# Patient Record
Sex: Male | Born: 2000 | Hispanic: No | Marital: Single | Smoking: Never smoker
Health system: Southern US, Community
[De-identification: ages and names within clinical notes are randomized; demographics above are authoritative.]

## PROBLEM LIST (undated history)

## (undated) DIAGNOSIS — F909 Attention-deficit hyperactivity disorder, unspecified type: Secondary | ICD-10-CM

## (undated) DIAGNOSIS — G4700 Insomnia, unspecified: Secondary | ICD-10-CM

---
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
## 2019-01-04 ENCOUNTER — Ambulatory Visit (HOSPITAL_COMMUNITY): Payer: Medicaid Other | Admitting: Psychiatry

## 2019-01-18 ENCOUNTER — Other Ambulatory Visit: Payer: Self-pay

## 2019-01-18 ENCOUNTER — Encounter (HOSPITAL_COMMUNITY): Payer: Self-pay | Admitting: Psychiatry

## 2019-01-18 ENCOUNTER — Ambulatory Visit (INDEPENDENT_AMBULATORY_CARE_PROVIDER_SITE_OTHER): Payer: Medicaid Other | Admitting: Psychiatry

## 2019-01-18 DIAGNOSIS — F4324 Adjustment disorder with disturbance of conduct: Secondary | ICD-10-CM

## 2019-01-18 NOTE — Progress Notes (Signed)
Client: Ryan Hammond  Date: 01/18/19  Time: 11:07-11:57  Type of Therapy: Family therapy with client  Axis I/II Diagnosis: Adjustment Disorder with mixed conduct; ADHD  Treatment goals addressed: To stabilize impulse control to better prepare for young adulthood and independent living.  Interventions: CBT, Motivational Interviewing, Psychoeducation, Coping Skill Building  Summary: Client Max, 18yo male who presents with Adjustment Disorder and ADHD, due to impulse control issues and intense aggression when violated. Counselor using therapeutic interventions to address negative impact impulse control issues and to prepare for completing GED, graduating from [place] program, building up support system, gaining employment and housing.  Therapist Response: Max and Caregiver, Kev met with Counselor for Family therapy. Counselor joined with Max and Kev as they reported on his visit home, upcoming birthday, family conflicts, and his housing and treatment plan. Counselor assessed current psychiatric symptoms and life stressors with Max. Max presented as less anxious than normal. Both Max and Kev reported being tired and sluggish, as their schedule and routine has been thrown off due to the COVID-19 regulations. Counselor processed Max's time with his family and explored his options for being discharged from residential treatments. Counselor praised Max for initiating services with [employer]. Max reported being hopeful about starting a job and being able to practice managing his money, which is part of the progress his mother is looking for. Counselor processed ways to make the most of his treatment and time and discussed peer relations. Counselor discussed meeting via webex at the next session and exchanged contact information. Counselor encouraged Max and Kev to continue supporting each other through this transition and to prompt use of health coping skills.  Suicidal/Homicidal: No current safety  concerns. No plan/intent to harm self or others.  Plan: To return in 3 weeks. Will apply skills learned in session at home, until next session.  ?  Lise Auer, LCSW

## 2019-02-08 ENCOUNTER — Ambulatory Visit (INDEPENDENT_AMBULATORY_CARE_PROVIDER_SITE_OTHER): Payer: Medicaid Other | Admitting: Psychiatry

## 2019-02-08 ENCOUNTER — Other Ambulatory Visit: Payer: Self-pay

## 2019-02-08 ENCOUNTER — Encounter (HOSPITAL_COMMUNITY): Payer: Self-pay | Admitting: Psychiatry

## 2019-02-08 DIAGNOSIS — F4324 Adjustment disorder with disturbance of conduct: Secondary | ICD-10-CM

## 2019-02-08 NOTE — Progress Notes (Signed)
Virtual Visit via Video Note  I connected with Ryan Hammond on 02/08/19 at 10:00 AM EDT by a video enabled telemedicine application and verified that I am speaking with the correct person using two identifiers.   I discussed the limitations of evaluation and management by telemedicine and the availability of in person appointments. The patient expressed understanding and agreed to proceed.  History of Present Illness: Adjustment disorder with disturbance of conduct due to trauma reactions, familial issues, adverse childhood events and stage of life issues.    Observations/Objective: Counselor met with Ryan Hammond via Webex for individual therapy to address treatment plan goals. Counselor assessed mental health status and symptoms. Ryan Hammond reported that his anxiety about the "stay-at-home" order has decreased and he is learning how to manage mental health in a healthier way. Counselor explored ways of coping. Ryan Hammond reported on a variety of coping skills such as playing basketball, doing projects, making social media videos, connecting with friends and family via facetime and trying to rest/relax. Counselor assessed progress towards independence. Ryan Hammond reported that he will staying in the program for another year, due to the set back with COVID-19. Ryan Hammond is ok with this change in plans and knows he is in a very supportive environment. Counselor summarized the session and encouraged maintenance of stability.   Assessment and Plan: Ryan Hammond will continue working on goals towards independence, including starting online GED program and following up with Support[employer] Employment. Counselor will meet with Ryan Hammond again in 3 weeks.  Follow Up Instructions: Counselor will set up link for next session via webex.    I discussed the assessment and treatment plan with the patient. The patient was provided an opportunity to ask questions and all were answered. The patient agreed with the plan and demonstrated an understanding of the  instructions.   The patient was advised to call back or seek an in-person evaluation if the symptoms worsen or if the condition fails to improve as anticipated.  I provided 45 minutes of non-face-to-face time during this encounter.   Lise Auer, LCSW

## 2019-03-01 ENCOUNTER — Encounter (HOSPITAL_COMMUNITY): Payer: Self-pay | Admitting: Psychiatry

## 2019-03-01 ENCOUNTER — Ambulatory Visit (INDEPENDENT_AMBULATORY_CARE_PROVIDER_SITE_OTHER): Payer: Medicaid Other | Admitting: Psychiatry

## 2019-03-01 ENCOUNTER — Other Ambulatory Visit: Payer: Self-pay

## 2019-03-01 DIAGNOSIS — F4324 Adjustment disorder with disturbance of conduct: Secondary | ICD-10-CM

## 2019-03-01 NOTE — Progress Notes (Signed)
Virtual Visit via Video Note  I connected with Ryan Hammond on 03/01/19 at  2:30 PM EDT by a video enabled telemedicine application and verified that I am speaking with the correct person using two identifiers.  Location: Patient: Ryan Hammond Provider:  , LCSW   I discussed the limitations of evaluation and management by telemedicine and the availability of in person appointments. The patient expressed understanding and agreed to proceed.  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    Observations/Objective: Counselor met with Ryan Hammond for individual therapy via Webex. Counselor assessed MH symptoms and progress on treatment plan goals. Ryan Hammond shared that everything is going well for him overall. No major depression or anxiety. He has been able to accept the stay at home orders and is preparing to start his GED program over the summer. He sounded hopeful about being able to complete it in a reasonable time and that he would start his vocational program once he knows his GED schedule. He and his caregiver report that he is following the rules in the home, getting along with housemates and contributing to the household through keeping things clean and helping with the dogs. Counselor praised Ryan Hammond for his ability to maintain in a positive manner and for his optimistic outlook on life. We discussed meeting less frequently since things are stable for him now. Ryan Hammond denied suicidal ideation or self-harm behaviors.   Assessment and Plan: Counselor will continue to meet with Ryan Hammond to address treatment plan goals. Ryan Hammond will continue to follow recommendations of providers and implement skills learned in session.  Follow Up Instructions: Counselor will send information for next session via Webex.     I discussed the assessment and treatment plan with the patient. The patient was provided an  opportunity to ask questions and all were answered. The patient agreed with the plan and demonstrated an understanding of the instructions.   The patient was advised to call back or seek an in-person evaluation if the symptoms worsen or if the condition fails to improve as anticipated.  I provided 35 minutes of non-face-to-face time during this encounter.    , LCSW  

## 2019-03-22 ENCOUNTER — Other Ambulatory Visit: Payer: Self-pay

## 2019-03-22 ENCOUNTER — Ambulatory Visit (INDEPENDENT_AMBULATORY_CARE_PROVIDER_SITE_OTHER): Payer: Medicaid Other | Admitting: Psychiatry

## 2019-03-22 ENCOUNTER — Encounter (HOSPITAL_COMMUNITY): Payer: Self-pay | Admitting: Psychiatry

## 2019-03-22 DIAGNOSIS — F902 Attention-deficit hyperactivity disorder, combined type: Secondary | ICD-10-CM | POA: Diagnosis not present

## 2019-03-22 DIAGNOSIS — F6381 Intermittent explosive disorder: Secondary | ICD-10-CM

## 2019-03-22 MED ORDER — ARIPIPRAZOLE 20 MG PO TABS: 20.0000 mg | ORAL_TABLET | Freq: Every day | ORAL | 2 refills | Status: DC

## 2019-03-22 MED ORDER — HYDROXYZINE HCL 25 MG PO TABS: 25.0000 mg | ORAL_TABLET | Freq: Two times a day (BID) | ORAL | 2 refills | Status: DC

## 2019-03-22 MED ORDER — AMANTADINE HCL 100 MG PO CAPS: 100.0000 mg | ORAL_CAPSULE | Freq: Two times a day (BID) | ORAL | 2 refills | Status: DC

## 2019-03-22 NOTE — Progress Notes (Signed)
Virtual Visit via Telephone Note  I connected with Ryan Hammond on 03/22/19 at  2:00 PM EDT by telephone and verified that I am speaking with the correct person using two identifiers.   I discussed the limitations, risks, security and privacy concerns of performing an evaluation and management service by telephone and the availability of in person appointments. I also discussed with the patient that there may be a patient responsible charge related to this service. The patient expressed understanding and agreed to proceed.   History of Present Illness: Patient was evaluated through phone session.  He is taking his medication and reported they are working very well.  He is not involved in any agitation, outbursts or aggressive behavior.  Feels the current medicine is working and does not want to change or reduce the dose.  He lives in [facility] and is getting along with the staff very well.  He had applied for GED and waiting for the response.  He denies any recent history of cutting himself.  He is not on any ADD medicine but he like to stay away from because medicines for ADD had made him more irritable and angry.  He feels proud that he is not drinking or using any illegal substances.  Sometimes he gets upset and then he walked away and does not like to confirm.  He reported no tremors, shakes or any EPS.  His appetite is good.  He admitted some weight gain but he feels that he is growing.  His energy level is good.  He wants to continue hydroxyzine, Abilify and amantadine.    Past Psychiatric History: Reviewed. Diagnosed with ADHD, autism spectrum disorder, explosive disorder, anger outburst and bipolar disorder but not clear history of significant depression, mania or hypomania.  H/O cutting and suicidal attempt by overdose at age 18. H/O multiple inpatient treatment including at [facility]. Tried Seroquel, Adderall and Vyvanse but stimulant caused impulsive and aggressive behavior.  Abilify, amantadine  and hydroxyzine worked well.  H/O of cannabis use.    Psychiatric Specialty Exam: Physical Exam  ROS  There were no vitals taken for this visit.There is no height or weight on file to calculate BMI.  General Appearance: NA  Eye Contact:  NA  Speech:  Clear and Coherent  Volume:  Normal  Mood:  Euthymic  Affect:  NA  Thought Process:  Goal Directed  Orientation:  Full (Time, Place, and Person)  Thought Content:  Logical  Suicidal Thoughts:  No  Homicidal Thoughts:  No  Memory:  Immediate;   Good Recent;   Good Remote;   Good  Judgement:  Good  Insight:  Good  Psychomotor Activity:  NA  Concentration:  Concentration: Fair and Attention Span: Fair  Recall:  Good  Fund of Knowledge:  Fair  Language:  Good  Akathisia:  NA  Handed:  Right  AIMS (if indicated):     Assets:  Communication Skills Desire for Improvement Housing Resilience Social Support  ADL's:  Intact  Cognition:  WNL  Sleep:   ok      Assessment and Plan: Intermittent explosive disorder.  ADHD combined type.  Patient is a stable on his current medication.  He has not been engaged in any outburst, anger and getting with the staff very well.  Like to continue his medication.  I will continue Abilify 20 mg daily, hydroxyzine 25 mg twice a day and amantadine 100 mg twice a day.  He is very reluctant to change or reduce the dose  of the medication.  We discussed medication side effects in detail.  I recommend to call us back if is any question or any concern.  We will do blood work on his next appointment.  Follow-up in 3 months.  Follow Up Instructions:    I discussed the assessment and treatment plan with the patient. The patient was provided an opportunity to ask questions and all were answered. The patient agreed with the plan and demonstrated an understanding of the instructions.   The patient was advised to call back or seek an in-person evaluation if the symptoms worsen or if the condition fails to  improve as anticipated.  I provided 15 minutes of non-face-to-face time during this encounter.   Cleotis NipperSyed T Adabella Stanis, MD

## 2019-04-12 ENCOUNTER — Ambulatory Visit (INDEPENDENT_AMBULATORY_CARE_PROVIDER_SITE_OTHER): Payer: Medicaid Other | Admitting: Psychiatry

## 2019-04-12 ENCOUNTER — Other Ambulatory Visit: Payer: Self-pay

## 2019-04-12 DIAGNOSIS — F4324 Adjustment disorder with disturbance of conduct: Secondary | ICD-10-CM | POA: Diagnosis not present

## 2019-04-13 ENCOUNTER — Encounter (HOSPITAL_COMMUNITY): Payer: Self-pay | Admitting: Psychiatry

## 2019-04-13 ENCOUNTER — Ambulatory Visit (HOSPITAL_COMMUNITY): Payer: Medicaid Other | Admitting: Psychiatry

## 2019-04-13 NOTE — Progress Notes (Signed)
Virtual Visit via Video Note  I connected with Ryan Hammond on 04/13/19 at  2:00 PM EDT by a video enabled telemedicine application and verified that I am speaking with the correct person using two identifiers.  Location: Patient: Ryan Hammond Provider: Lise Auer, LCSW   I discussed the limitations of evaluation and management by telemedicine and the availability of in person appointments. The patient expressed understanding and agreed to proceed.  History of Present Illness: Adjustment Disorder with disturbance in conduct   Observations/Objective: Counselor met with Ryan Hammond for individual therapy via Webex. Counselor assessed MH symptoms and progress on treatment plan goals. Ryan Hammond denied suicidal ideation or self-harm behaviors. Ryan Hammond shared that recently he and his provider got into an argument where Ryan Hammond became inappropriate, causing his treatment plan to change to move to a new provider and closer to his family at the [place]. Counselor and Ryan Hammond processed the situation and he shared how he would prevent or do things differently in the future. Counselor assessed his skills and considerations in transitioning back closer to family. Counselor will continue to provide outpatient therapy virtually, until he is set up with another therapist. Counselor processed family dynamics and anticipatory challenges he may face as he re-enters his families daily lives, as he has not lived near them in several years. Counselor offered for provider to reach out to share perspectives on the needs and transition.   Assessment and Plan: Counselor will continue to meet with Ryan Hammond to address treatment plan goals. Ryan Hammond will continue to follow recommendations of providers and implement skills learned in session.  Follow Up Instructions: Counselor will send information for next session via Webex.     I discussed the assessment and treatment plan with the patient. The patient was provided an opportunity to ask questions and  all were answered. The patient agreed with the plan and demonstrated an understanding of the instructions.   The patient was advised to call back or seek an in-person evaluation if the symptoms worsen or if the condition fails to improve as anticipated.  I provided 30 minutes of non-face-to-face time during this encounter.   Lise Auer, LCSW

## 2019-04-27 ENCOUNTER — Other Ambulatory Visit (HOSPITAL_COMMUNITY): Payer: Self-pay

## 2019-04-27 DIAGNOSIS — F6381 Intermittent explosive disorder: Secondary | ICD-10-CM

## 2019-04-27 MED ORDER — ARIPIPRAZOLE 20 MG PO TABS: 20.0000 mg | ORAL_TABLET | Freq: Every day | ORAL | 2 refills | Status: AC

## 2019-04-27 MED ORDER — AMANTADINE HCL 100 MG PO CAPS: 100.0000 mg | ORAL_CAPSULE | Freq: Two times a day (BID) | ORAL | 2 refills | Status: AC

## 2019-04-27 MED ORDER — HYDROXYZINE HCL 25 MG PO TABS
25.0000 mg | ORAL_TABLET | Freq: Two times a day (BID) | ORAL | 2 refills | Status: AC
Start: 2019-04-27 — End: ?

## 2019-05-05 ENCOUNTER — Other Ambulatory Visit: Payer: Self-pay

## 2019-05-05 ENCOUNTER — Ambulatory Visit (HOSPITAL_COMMUNITY): Payer: Medicaid Other | Admitting: Psychiatry

## 2019-05-30 ENCOUNTER — Other Ambulatory Visit (HOSPITAL_COMMUNITY): Payer: Self-pay | Admitting: Psychiatry

## 2019-05-30 DIAGNOSIS — F6381 Intermittent explosive disorder: Secondary | ICD-10-CM

## 2019-06-08 ENCOUNTER — Other Ambulatory Visit (HOSPITAL_COMMUNITY): Payer: Self-pay | Admitting: Psychiatry

## 2019-06-08 DIAGNOSIS — F6381 Intermittent explosive disorder: Secondary | ICD-10-CM

## 2019-06-22 ENCOUNTER — Ambulatory Visit (HOSPITAL_COMMUNITY): Payer: Medicaid Other | Admitting: Psychiatry

## 2020-07-15 IMAGING — DX DG KNEE COMPLETE 4+V*L*
4 series · 4 of 4 positions shown · non-contrast
Comparison: None.

CLINICAL DATA: Twisting injury playing football 2 weeks ago with
persistent pain, initial encounter

EXAM:
LEFT KNEE - COMPLETE 4+ VIEW

[knee ap]
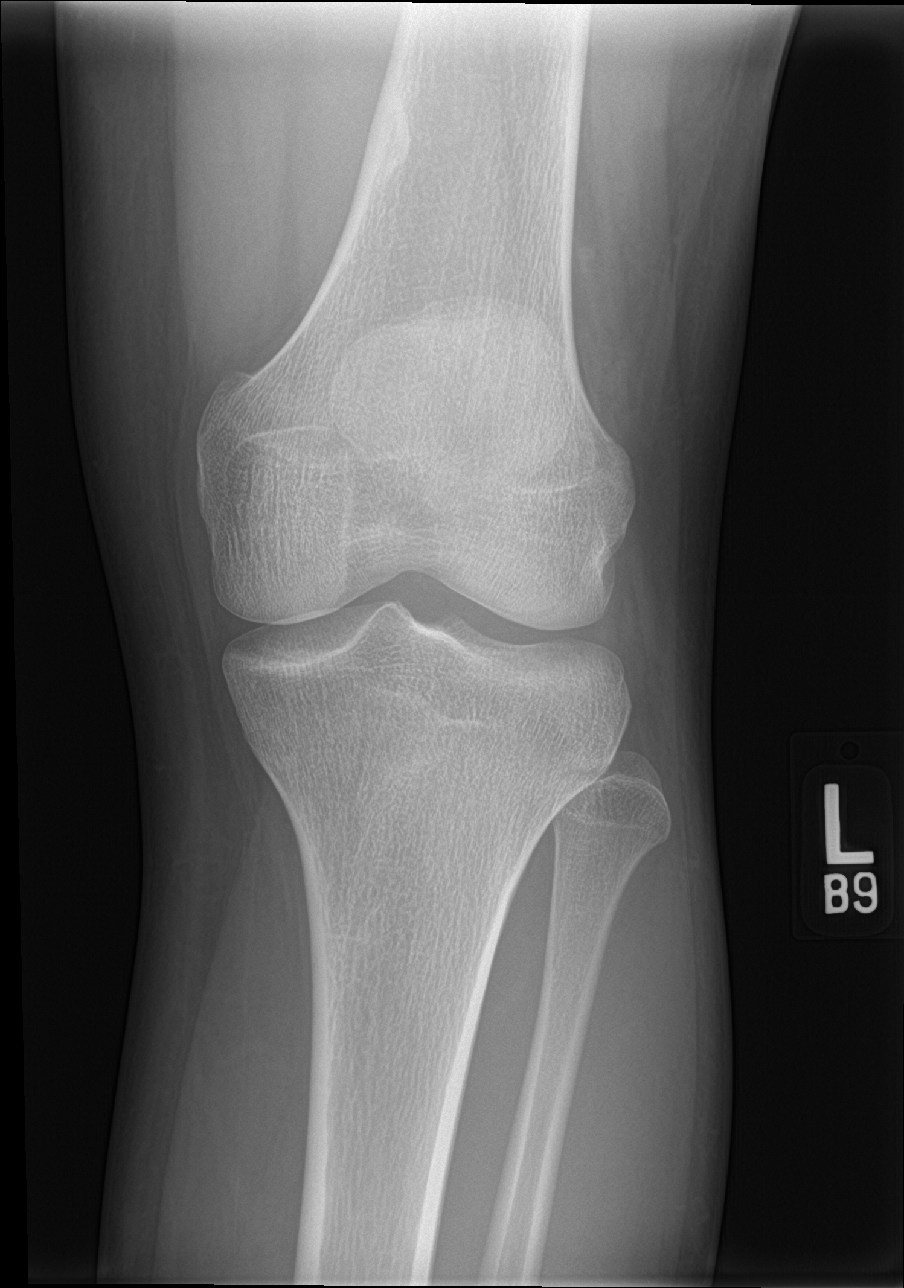

[knee obl (1 of 2)]
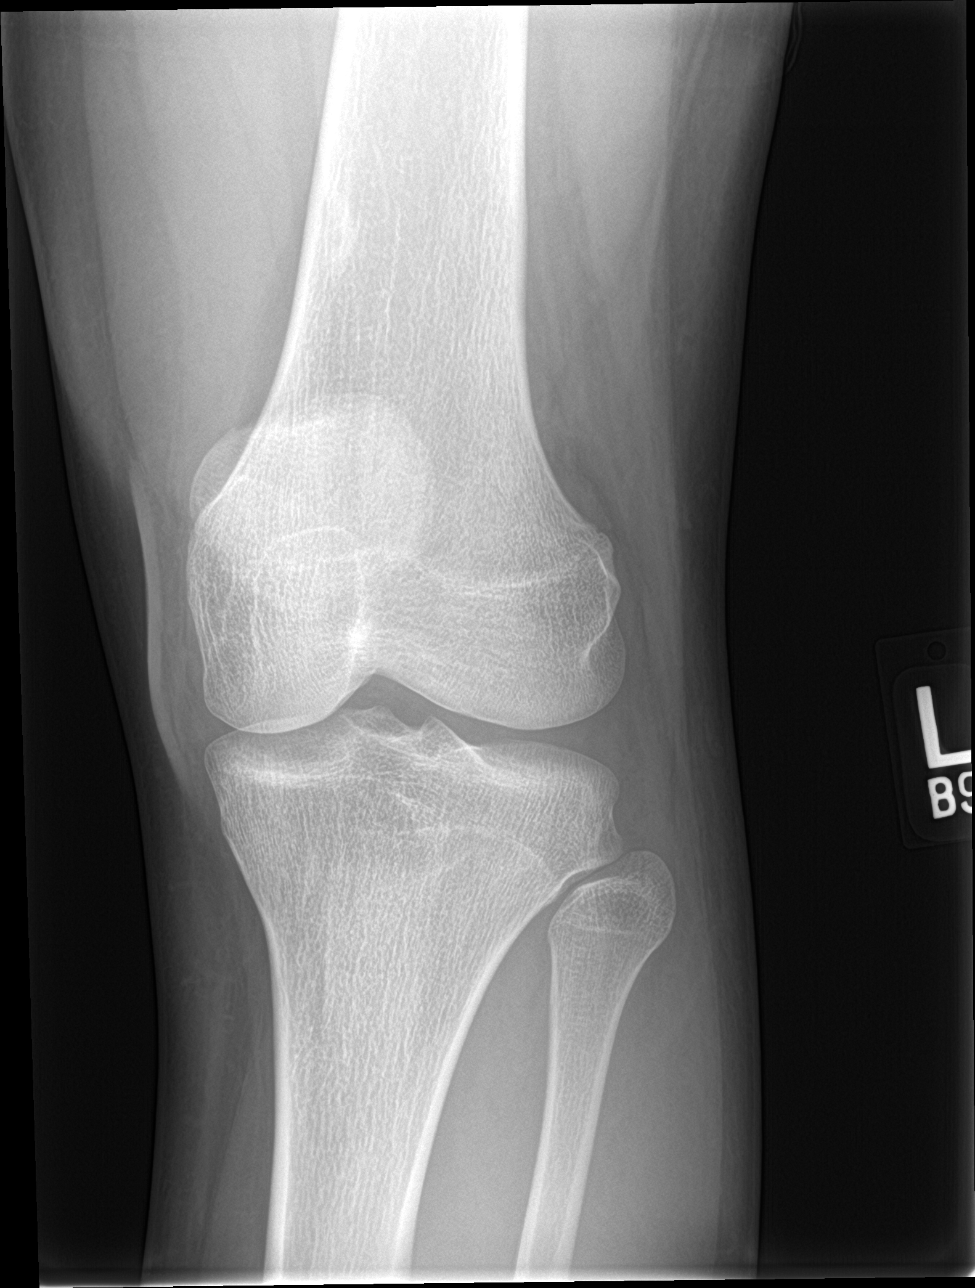

[knee obl (2 of 2)]
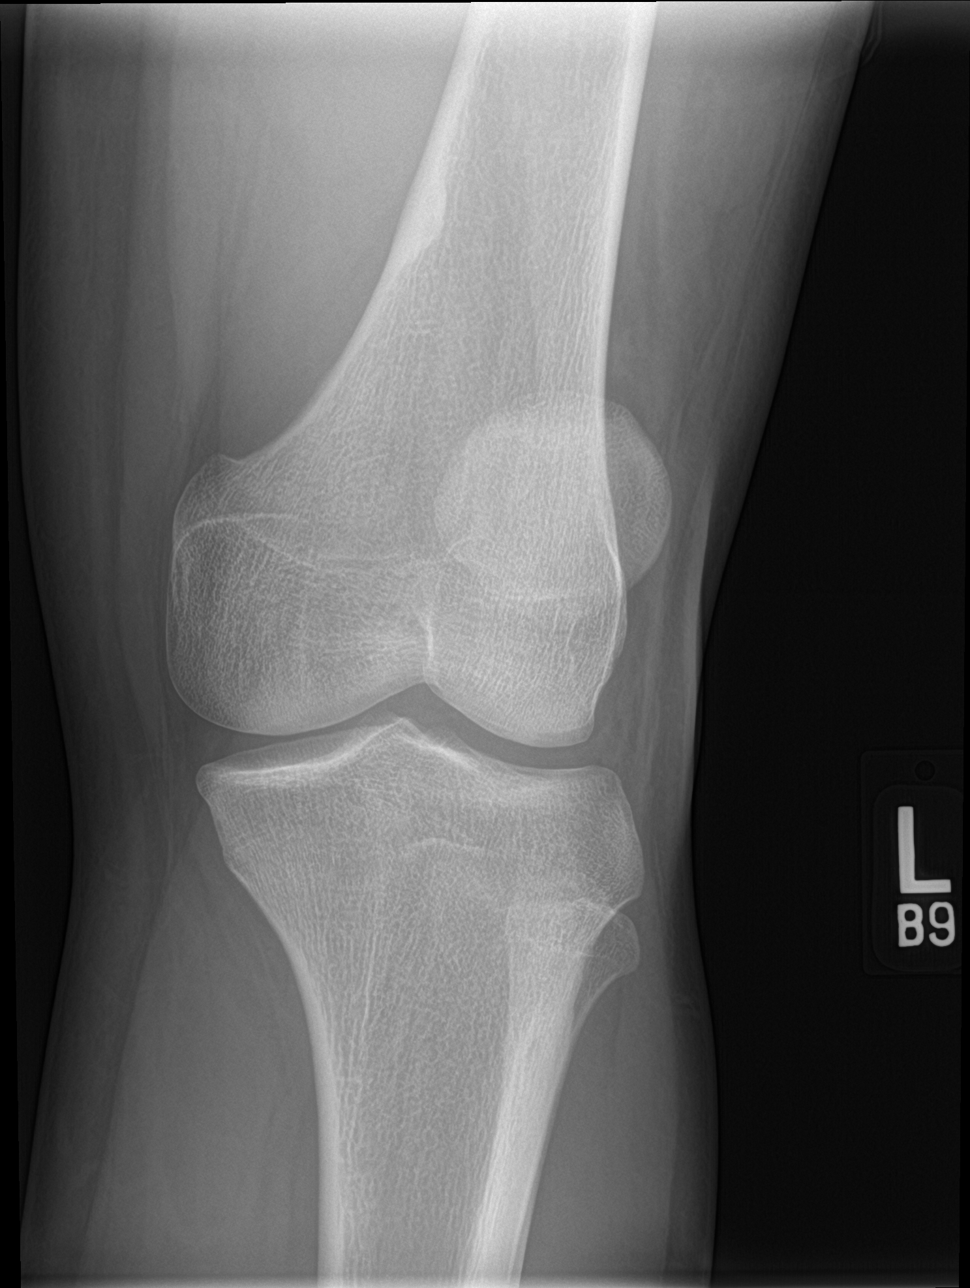

[knee lat]
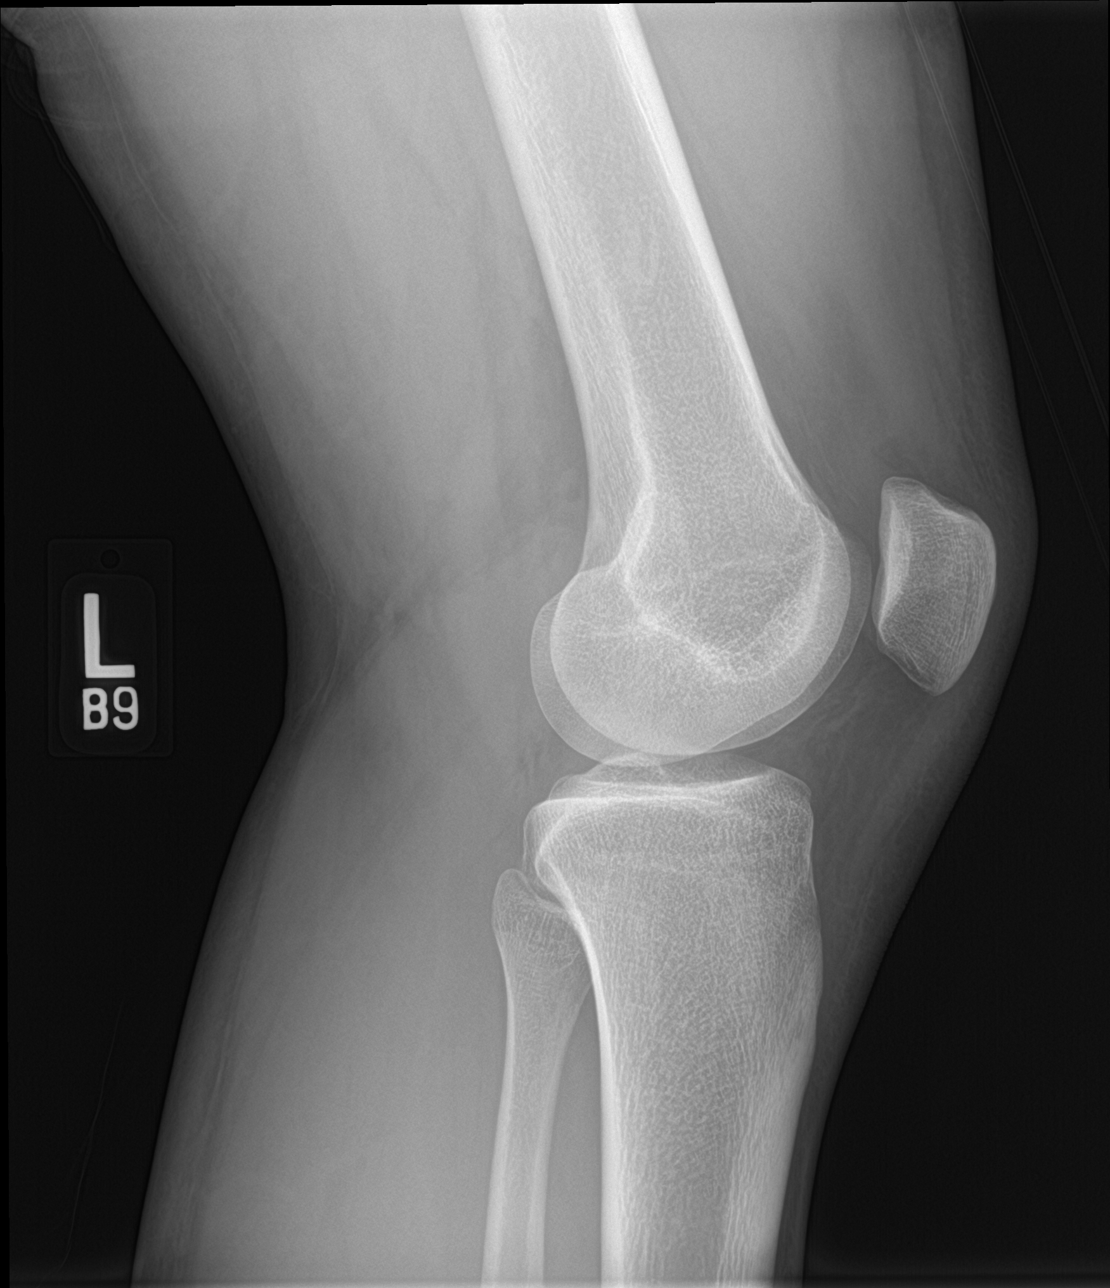

[4 of 4 positions shown; findings below may reference images not displayed]

FINDINGS: No acute fracture or dislocation is noted. No soft tissue
abnormality is noted. Benign fibrous cortical defect is noted in the
distal femoral shaft. No soft tissue changes are seen.
IMPRESSION: No acute bony abnormality noted.
